# Patient Record
Sex: Male | Born: 1975 | Hispanic: No | Marital: Married | Smoking: Current every day smoker
Health system: Southern US, Community
[De-identification: ages and names within clinical notes are randomized; demographics above are authoritative.]

---

## 2018-12-19 ENCOUNTER — Other Ambulatory Visit: Payer: Self-pay

## 2018-12-19 ENCOUNTER — Emergency Department (HOSPITAL_COMMUNITY)
Admission: EM | Admit: 2018-12-19 | Discharge: 2018-12-20 | Disposition: A | Discharge status: Home / Self Care | Payer: BC Managed Care – PPO | Attending: Emergency Medicine | Admitting: Emergency Medicine

## 2018-12-19 ENCOUNTER — Emergency Department (HOSPITAL_COMMUNITY): Payer: BC Managed Care – PPO

## 2018-12-19 ENCOUNTER — Encounter (HOSPITAL_COMMUNITY): Payer: Self-pay | Admitting: Emergency Medicine

## 2018-12-19 DIAGNOSIS — W1839XA Other fall on same level, initial encounter: Secondary | ICD-10-CM | POA: Insufficient documentation

## 2018-12-19 DIAGNOSIS — S43014A Anterior dislocation of right humerus, initial encounter: Secondary | ICD-10-CM | POA: Insufficient documentation

## 2018-12-19 DIAGNOSIS — S42291A Other displaced fracture of upper end of right humerus, initial encounter for closed fracture: Secondary | ICD-10-CM | POA: Diagnosis not present

## 2018-12-19 DIAGNOSIS — Y9389 Activity, other specified: Secondary | ICD-10-CM | POA: Insufficient documentation

## 2018-12-19 DIAGNOSIS — F17210 Nicotine dependence, cigarettes, uncomplicated: Secondary | ICD-10-CM | POA: Insufficient documentation

## 2018-12-19 DIAGNOSIS — Y999 Unspecified external cause status: Secondary | ICD-10-CM | POA: Diagnosis not present

## 2018-12-19 DIAGNOSIS — Y92512 Supermarket, store or market as the place of occurrence of the external cause: Secondary | ICD-10-CM | POA: Insufficient documentation

## 2018-12-19 DIAGNOSIS — R569 Unspecified convulsions: Secondary | ICD-10-CM | POA: Diagnosis not present

## 2018-12-19 DIAGNOSIS — S4291XA Fracture of right shoulder girdle, part unspecified, initial encounter for closed fracture: Secondary | ICD-10-CM

## 2018-12-19 DIAGNOSIS — S4991XA Unspecified injury of right shoulder and upper arm, initial encounter: Secondary | ICD-10-CM | POA: Diagnosis present

## 2018-12-19 LAB — CBG MONITORING, ED: Glucose-Capillary: 212 mg/dL — ABNORMAL HIGH (ref 70–99)

## 2018-12-19 MED ORDER — MORPHINE SULFATE (PF) 4 MG/ML IV SOLN
4.0000 mg | Freq: Once | INTRAVENOUS | Status: AC
  Administered 2018-12-20: 4 mg via INTRAVENOUS
  Filled 2018-12-19: qty 1

## 2018-12-19 NOTE — ED Provider Notes (Signed)
[facility] Provider Note   CSN: [id number] Arrival date & time: 12/19/18  2309     History   Chief Complaint Chief Complaint  Patient presents with  . Seizures    HPI Rodney Davenport is a 43 y.o. male.     Patient is a 43 year old male with no significant past medical history.  He presents today for evaluation of possible seizure activity.  Patient states that he went to [facility] this afternoon and had some sort of episode.  According to him, he became unresponsive and fell to the floor.  He was told that he was shaking all over.  The ambulance was called, however the patient was back to normal and declined treatment.  After arriving home, the patient began experiencing worsening right shoulder pain and presents for evaluation of this.  He is also having some discomfort in his back.  He denies any fevers or chills.  He denies any headache or weakness.  Patient denies any illicit drug use, but does state that he is a weekend consumer of alcohol.  The history is provided by the patient.  Seizures Seizure activity on arrival: no   Seizure type:  Grand mal Initial focality:  None Postictal symptoms: no confusion   Return to baseline: yes   Severity:  Moderate Timing:  Once   History reviewed. No pertinent past medical history.  There are no active problems to display for this patient.   History reviewed. No pertinent surgical history.      Home Medications    Prior to Admission medications   Not on File    Family History No family history on file.  Social History Social History   Tobacco Use  . Smoking status: Current Every Day Smoker    Packs/day: 0.50    Types: Cigarettes  . Smokeless tobacco: Never Used  Substance Use Topics  . Alcohol use: Yes    Comment: drinks wine every weekend  . Drug use: Not Currently     Allergies   Patient has no known allergies.   Review of Systems Review of Systems   Neurological: Positive for seizures.  All other systems reviewed and are negative.    Physical Exam Updated Vital Signs BP (!) 155/106 (BP Location: Left Arm)   Pulse (!) 123   Temp 98.1 F (36.7 C) (Oral)   Resp 20   Ht 5\' 11"  (1.803 m)   Wt 75 kg   SpO2 96%   BMI 23.06 kg/m   Physical Exam Vitals signs and nursing note reviewed.  Constitutional:      General: He is not in acute distress.    Appearance: He is well-developed. He is not diaphoretic.  HENT:     Head: Normocephalic and atraumatic.  Eyes:     Extraocular Movements: Extraocular movements intact.     Pupils: Pupils are equal, round, and reactive to light.  Neck:     Musculoskeletal: Normal range of motion and neck supple.  Cardiovascular:     Rate and Rhythm: Normal rate and regular rhythm.     Heart sounds: No murmur. No friction rub.  Pulmonary:     Effort: Pulmonary effort is normal. No respiratory distress.     Breath sounds: Normal breath sounds. No wheezing or rales.  Abdominal:     General: Bowel sounds are normal. There is no distension.     Palpations: Abdomen is soft.     Tenderness: There is no abdominal tenderness.  Musculoskeletal:  Normal range of motion.     Comments: There is tenderness over the lateral aspect of the right shoulder.  There is an apparent glenohumeral dislocation.  Ulnar and radial pulses are easily palpable.  He is able to flex, extend, and oppose all fingers without difficulty.  Skin:    General: Skin is warm and dry.  Neurological:     General: No focal deficit present.     Mental Status: He is alert and oriented to person, place, and time.     Cranial Nerves: No cranial nerve deficit.     Coordination: Coordination normal.      ED Treatments / Results  Labs (all labs ordered are listed, but only abnormal results are displayed) Labs Reviewed  CBG MONITORING, ED - Abnormal; Notable for the following components:      Result Value   Glucose-Capillary 212 (*)    All  other components within normal limits  COMPREHENSIVE METABOLIC PANEL  CBC WITH DIFFERENTIAL/PLATELET  ETHANOL  URINALYSIS, ROUTINE W REFLEX MICROSCOPIC  RAPID URINE DRUG SCREEN, HOSP PERFORMED    EKG EKG Interpretation  Date/Time:  Sunday December 19 2018 23:21:43 EDT Ventricular Rate:  116 PR Interval:    QRS Duration: 89 QT Interval:  323 QTC Calculation: 449 R Axis:   -58 Text Interpretation:  Sinus tachycardia Inferior infarct, old Left axis deviation Confirmed by Veryl Speak [phone]) on 12/19/2018 11:29:25 PM   Radiology No results found.  Procedures Reduction of dislocation  Date/Time: 12/20/2018 3:02 AM Performed by: Veryl Speak, MD Authorized by: Veryl Speak, MD  Consent: Verbal consent obtained. Written consent obtained. Risks and benefits: risks, benefits and alternatives were discussed Consent given by: patient Patient understanding: patient states understanding of the procedure being performed Patient consent: the patient's understanding of the procedure matches consent given Procedure consent: procedure consent matches procedure scheduled Relevant documents: relevant documents present and verified Test results: test results available and properly labeled Imaging studies: imaging studies available Patient identity confirmed: verbally with patient, arm band and hospital-assigned identification number Time out: Immediately prior to procedure a "time out" was called to verify the correct patient, procedure, equipment, support staff and site/side marked as required. Local anesthesia used: no  Anesthesia: Local anesthesia used: no  Sedation: Patient sedated: yes Sedation type: moderate (conscious) sedation Sedatives: propofol  Patient tolerance: patient tolerated the procedure well with no immediate complications  .Sedation  Date/Time: 12/20/2018 3:03 AM Performed by: Veryl Speak, MD Authorized by: Veryl Speak, MD   Consent:    Consent obtained:   Verbal and written   Consent given by:  Patient   Risks discussed:  Allergic reaction, inadequate sedation, prolonged hypoxia resulting in organ damage and respiratory compromise necessitating ventilatory assistance and intubation Universal protocol:    Procedure explained and questions answered to patient or proxy's satisfaction: yes     Relevant documents present and verified: yes     Test results available and properly labeled: yes     Imaging studies available: yes     Immediately prior to procedure a time out was called: yes     Patient identity confirmation method:  Arm band, verbally with patient and hospital-assigned identification number Indications:    Procedure performed:  Dislocation reduction   Procedure necessitating sedation performed by:  Physician performing sedation Pre-sedation assessment:    Time since last food or drink:  4 hours   ASA classification: class 1 - normal, healthy patient     Mallampati score:  I - soft palate,  uvula, fauces, pillars visible   Pre-sedation assessments completed and reviewed: airway patency, cardiovascular function, mental status and pain level   Immediate pre-procedure details:    Reassessment: Patient reassessed immediately prior to procedure     Reviewed: vital signs and relevant labs/tests     Verified: bag valve mask available, intubation equipment available, IV patency confirmed and oxygen available   Procedure details (see MAR for exact dosages):    Preoxygenation:  Nasal cannula   Sedation:  Propofol   Intra-procedure monitoring:  Blood pressure monitoring, cardiac monitor, continuous capnometry, continuous pulse oximetry, frequent LOC assessments and frequent vital sign checks   Intra-procedure events: none     Total Provider sedation time (minutes):  20 Post-procedure details:    Post-sedation assessments completed and reviewed: airway patency, cardiovascular function, mental status and pain level     Patient is stable for  discharge or admission: yes     Patient tolerance:  Tolerated well, no immediate complications   (including critical care time)  Medications Ordered in ED Medications  morphine 4 MG/ML injection 4 mg (has no administration in time range)     Initial Impression / Assessment and Plan / ED Course  I have reviewed the triage vital signs and the nursing notes.  Pertinent labs & imaging results that were available during my care of the patient were reviewed by me and considered in my medical decision making (see chart for details).  Patient presents here with complaints of right shoulder pain.  Patient had what sounds like a seizure while at a local drugstore.  He went home after refusing care there and had increasing back and right shoulder pain.  X-rays today reveal a fracture/dislocation of the humerus.  This finding was discussed with Dr. Jena GaussHaddix from orthopedics who recommended reduction in the ED.  This dislocation was easily reduced using conscious sedation with propofol.  X-rays also show what appear to be compression fractures of the thoracic spine and possibly a transverse process fracture in the lumbar spine.  He is neurologically intact and I do not feel these injuries require further imaging.  As far as the episode that occurred in the drugstore, I highly suspect this was a seizure.  Patient lost consciousness and witnesses state that he was shaking for approximately 5 minutes.  Patient has bite marks to the left side of his tongue.  His seizure work-up today is essentially unremarkable.  His glucose is mildly elevated at 205 and patient has no prior history of diabetes.  He also has mildly elevated transaminases.  Patient does admit to drinking alcohol with regularity.  This episode may have been an alcohol withdrawal seizure.    It was discussed with Dr. Otelia LimesLindzen from neurology who does not feel as though hospitalization or medication is indicated at this time.  Patient advised to limit  his alcohol intake.  He will follow-up with orthopedics regarding his right shoulder.    CRITICAL CARE Performed by: Geoffery Lyonsouglas Soham Hollett Total critical care time: 70 minutes Critical care time was exclusive of separately billable procedures and treating other patients. Critical care was necessary to treat or prevent imminent or life-threatening deterioration. Critical care was time spent personally by me on the following activities: development of treatment plan with patient and/or surrogate as well as nursing, discussions with consultants, evaluation of patient's response to treatment, examination of patient, obtaining history from patient or surrogate, ordering and performing treatments and interventions, ordering and review of laboratory studies, ordering and review of radiographic studies,  pulse oximetry and re-evaluation of patient's condition.   Final Clinical Impressions(s) / ED Diagnoses   Final diagnoses:  None    ED Discharge Orders    None       Geoffery Lyonselo, Asli Tokarski, MD 12/20/18 [phone]

## 2018-12-19 NOTE — ED Triage Notes (Signed)
Roommate called EMS stating pt had some seizure like activity without a history of seizures. Pt fell earlier today at [facility] and c/o back, right arm and shoulder pain from fall.

## 2018-12-19 NOTE — ED Notes (Signed)
To x-ray

## 2018-12-20 ENCOUNTER — Emergency Department (HOSPITAL_COMMUNITY): Payer: BC Managed Care – PPO

## 2018-12-20 LAB — CBC WITH DIFFERENTIAL/PLATELET
Abs Immature Granulocytes: 0.08 10*3/uL — ABNORMAL HIGH (ref 0.00–0.07)
Basophils Absolute: 0 10*3/uL (ref 0.0–0.1)
Basophils Relative: 0 %
Eosinophils Absolute: 0 10*3/uL (ref 0.0–0.5)
Eosinophils Relative: 0 %
HCT: 41.3 % (ref 39.0–52.0)
Hemoglobin: 13.9 g/dL (ref 13.0–17.0)
Immature Granulocytes: 1 %
Lymphocytes Relative: 8 %
Lymphs Abs: 0.9 10*3/uL (ref 0.7–4.0)
MCH: 31.9 pg (ref 26.0–34.0)
MCHC: 33.7 g/dL (ref 30.0–36.0)
MCV: 94.7 fL (ref 80.0–100.0)
Monocytes Absolute: 0.4 10*3/uL (ref 0.1–1.0)
Monocytes Relative: 4 %
Neutro Abs: 9.8 10*3/uL — ABNORMAL HIGH (ref 1.7–7.7)
Neutrophils Relative %: 87 %
Platelets: 155 10*3/uL (ref 150–400)
RBC: 4.36 MIL/uL (ref 4.22–5.81)
RDW: 12.7 % (ref 11.5–15.5)
WBC: 11.2 10*3/uL — ABNORMAL HIGH (ref 4.0–10.5)
nRBC: 0 % (ref 0.0–0.2)

## 2018-12-20 LAB — COMPREHENSIVE METABOLIC PANEL
ALT: 74 U/L — ABNORMAL HIGH (ref 0–44)
AST: 148 U/L — ABNORMAL HIGH (ref 15–41)
Albumin: 3.7 g/dL (ref 3.5–5.0)
Alkaline Phosphatase: 96 U/L (ref 38–126)
Anion gap: 14 (ref 5–15)
BUN: 10 mg/dL (ref 6–20)
CO2: 18 mmol/L — ABNORMAL LOW (ref 22–32)
Calcium: 8.7 mg/dL — ABNORMAL LOW (ref 8.9–10.3)
Chloride: 98 mmol/L (ref 98–111)
Creatinine, Ser: 0.98 mg/dL (ref 0.61–1.24)
GFR calc Af Amer: >60 mL/min (ref >60)
GFR calc non Af Amer: >60 mL/min (ref >60)
Glucose, Bld: 205 mg/dL — ABNORMAL HIGH (ref 70–99)
Potassium: 3.4 mmol/L — ABNORMAL LOW (ref 3.5–5.1)
Sodium: 130 mmol/L — ABNORMAL LOW (ref 135–145)
Total Bilirubin: 2.2 mg/dL — ABNORMAL HIGH (ref 0.3–1.2)
Total Protein: 6 g/dL — ABNORMAL LOW (ref 6.5–8.1)

## 2018-12-20 LAB — ETHANOL: Alcohol, Ethyl (B): <10 mg/dL (ref <10)

## 2018-12-20 MED ORDER — HYDROCODONE-ACETAMINOPHEN 5-325 MG PO TABS: 1.0000 | ORAL_TABLET | Freq: Four times a day (QID) | ORAL | 0 refills | Status: AC | PRN

## 2018-12-20 MED ORDER — PROPOFOL 10 MG/ML IV BOLUS
0.5000 mg/kg | Freq: Once | INTRAVENOUS | Status: AC
  Administered 2018-12-20: 37.5 mg via INTRAVENOUS
  Filled 2018-12-20: qty 20

## 2018-12-20 MED ORDER — PROPOFOL 10 MG/ML IV BOLUS
INTRAVENOUS | Status: AC | PRN
  Administered 2018-12-20 (×2): 40 mg via INTRAVENOUS
  Administered 2018-12-20: 37.5 mg via INTRAVENOUS

## 2018-12-20 NOTE — Sedation Documentation (Signed)
Paper op permit signed and placed in medical records

## 2018-12-20 NOTE — Sedation Documentation (Signed)
Pt alert talking  Still having pain in his rt shoulder  Ortho tech has placed a sling to the rt shoulder

## 2018-12-20 NOTE — Discharge Instructions (Signed)
Wear arm sling as applied until followed up by orthopedics.  You should see the orthopedic surgeon in the next few days.  The contact information for Dr. Doreatha Martin has been provided in this discharge summary for you to call and make these arrangements.  Follow-up with neurology in the next week.  The contact information for [facility] neurology has been provided in this discharge summary as well for you to call and make these arrangements.  Take hydrocodone as prescribed as needed for pain.  Ice for 20 minutes every 2 hours while awake for the next 2 days.  No driving until followed up by neurology.

## 2018-12-20 NOTE — Sedation Documentation (Signed)
The pt received 120 mg of propofol iv in 40mg  increments for procedure.

## 2018-12-23 ENCOUNTER — Other Ambulatory Visit: Payer: Self-pay | Admitting: Orthopaedic Surgery

## 2018-12-23 DIAGNOSIS — G8929 Other chronic pain: Secondary | ICD-10-CM

## 2018-12-24 ENCOUNTER — Ambulatory Visit
Admission: RE | Admit: 2018-12-24 | Discharge: 2018-12-24 | Disposition: A | Discharge status: Home / Self Care | Payer: BC Managed Care – PPO | Source: Ambulatory Visit | Attending: Orthopaedic Surgery | Admitting: Orthopaedic Surgery

## 2018-12-24 ENCOUNTER — Other Ambulatory Visit: Payer: Self-pay

## 2018-12-24 DIAGNOSIS — G8929 Other chronic pain: Secondary | ICD-10-CM

## 2019-01-17 ENCOUNTER — Other Ambulatory Visit: Payer: BC Managed Care – PPO

## 2020-07-08 IMAGING — CT CT HEAD WITHOUT CONTRAST
4 series · 15 of 47 positions shown, 17 images · non-contrast
Comparison: None.

CLINICAL DATA: 42 y/o M; fall earlier today. Seizure-like activity
without history of seizures.

EXAM:
CT HEAD WITHOUT CONTRAST
TECHNIQUE: Contiguous axial images were obtained from the base of the skull
through the vertex without intravenous contrast.

[Series 4: head without · axial · non-contrast · 0.44mm/px · z∈[-80,+40]mm · 7 of 32 slices shown, 9 images]
[im 4/32  brain]
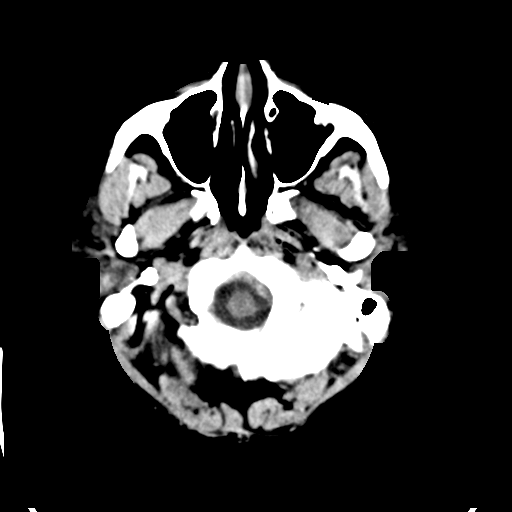
[im 4/32  bone]
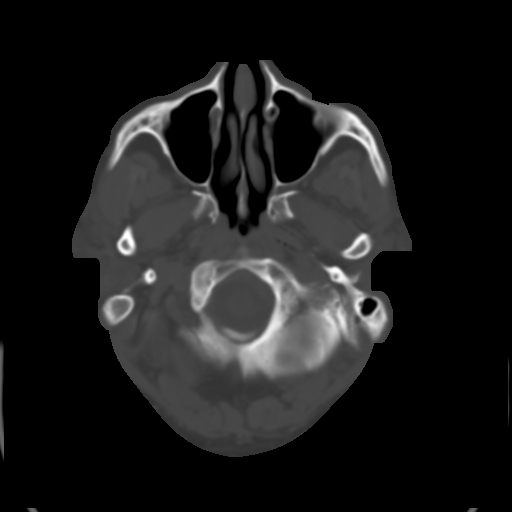
[im 8/32  brain]
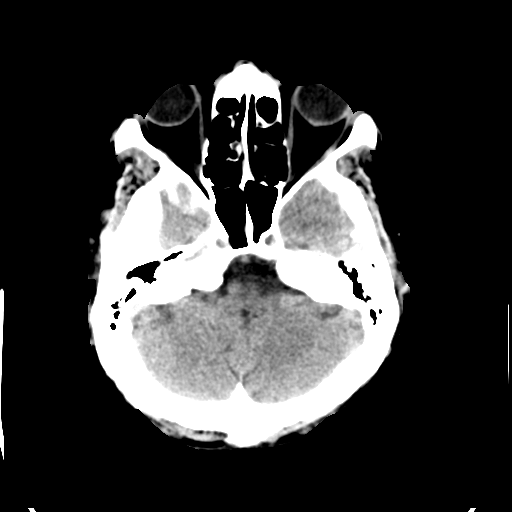
[im 12/32  brain]
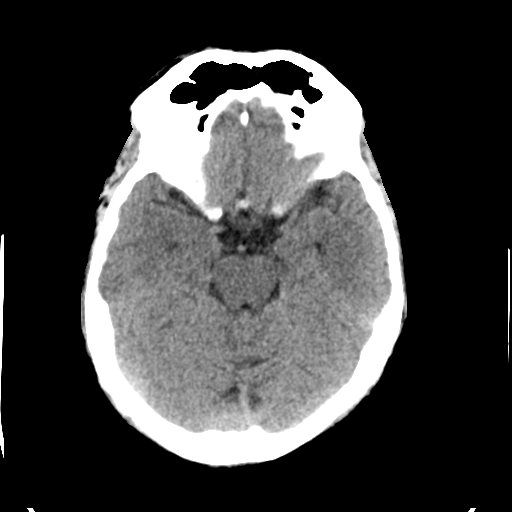
[im 16/32  brain]
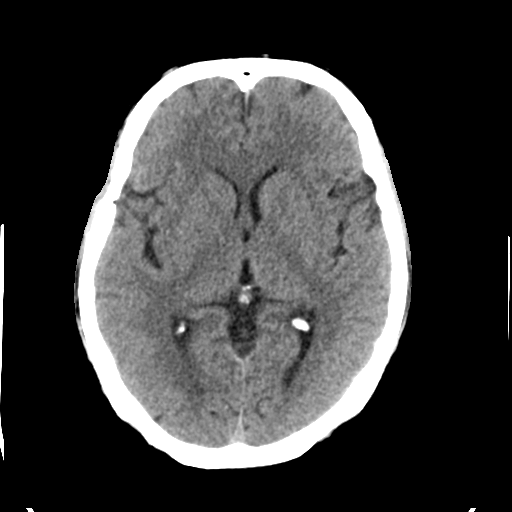
[im 20/32  brain]
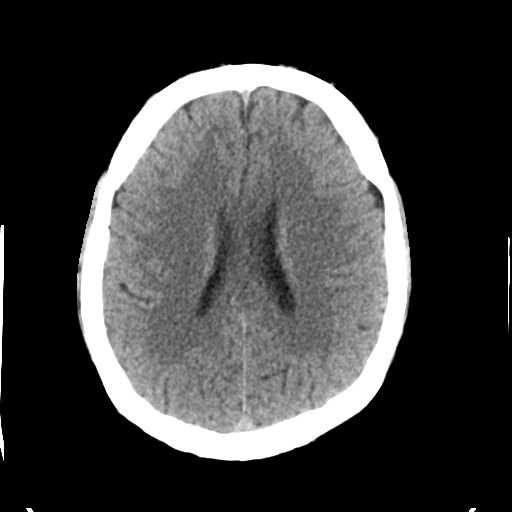
[im 20/32  bone]
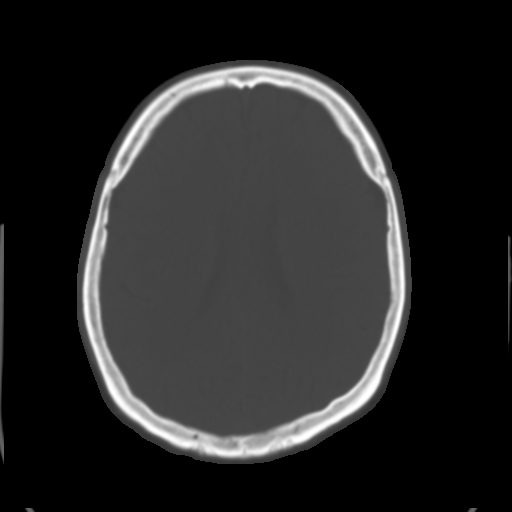
[im 24/32  brain]
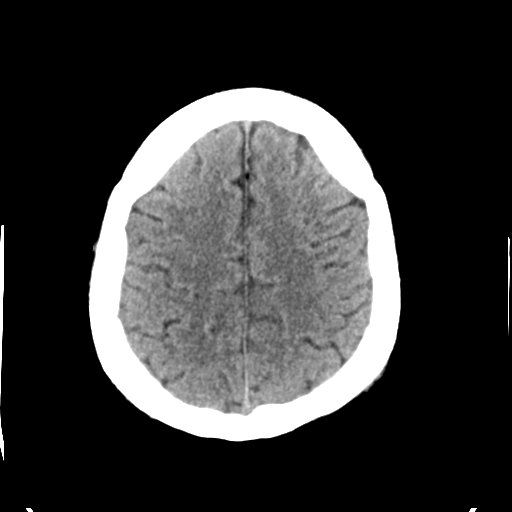
[im 28/32  brain]
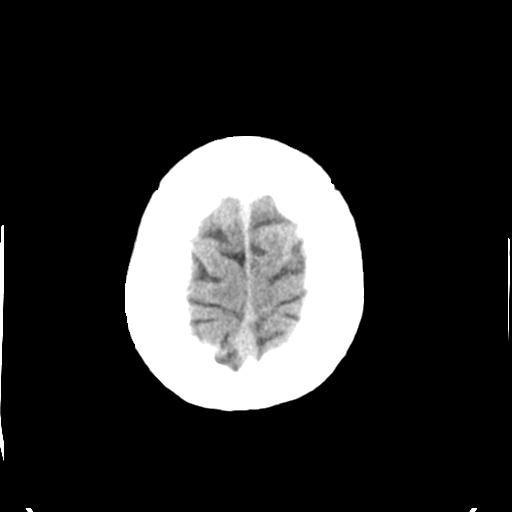

[Series 5: head bone · axial · 0.44mm/px · z∈[-81,-65]mm · 2 of 80 slices shown]
[im 8/80  bone]
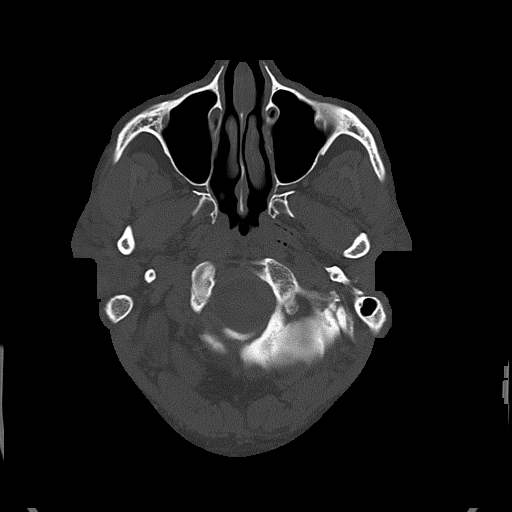
[im 16/80  bone]
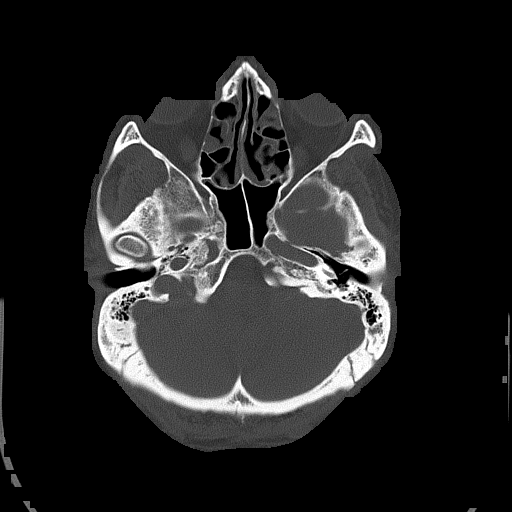

[Series 6: head without cor · coronal · non-contrast · 0.31mm/px · 3 of 67 slices shown]
[im 23/67  brain]
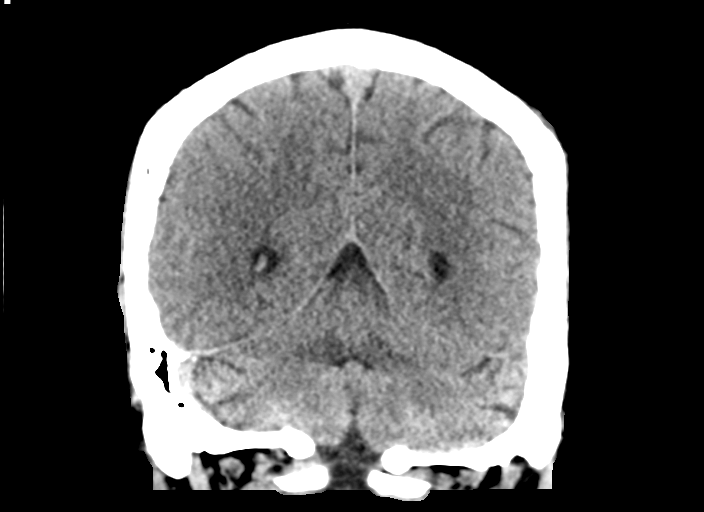
[im 30/67  brain]
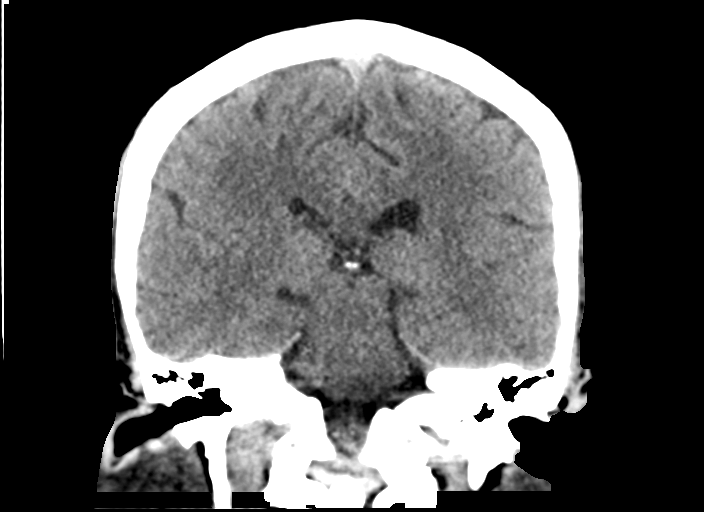
[im 37/67  brain]
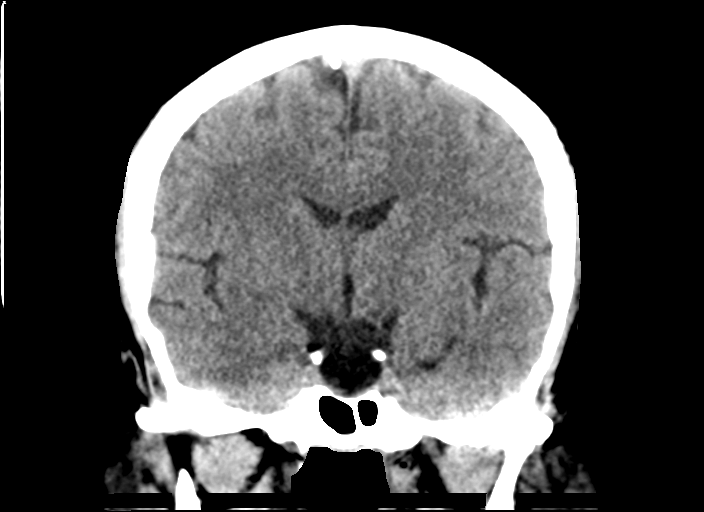

[Series 7: head without sag · sagittal · non-contrast · 0.31mm/px · 3 of 63 slices shown]
[im 21/63  brain]
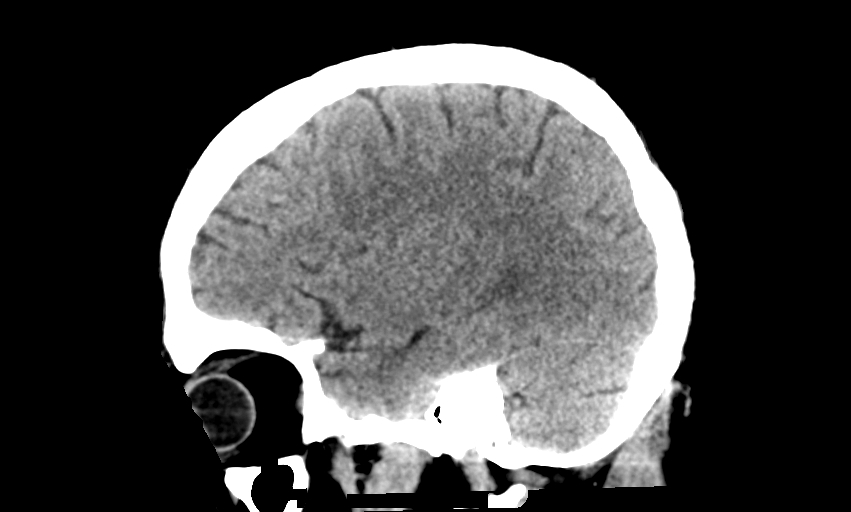
[im 32/63  brain]
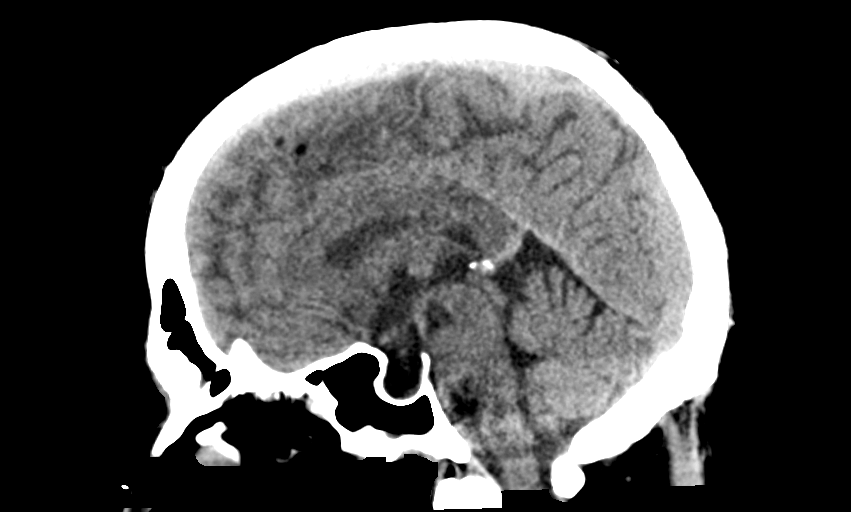
[im 42/63  brain]
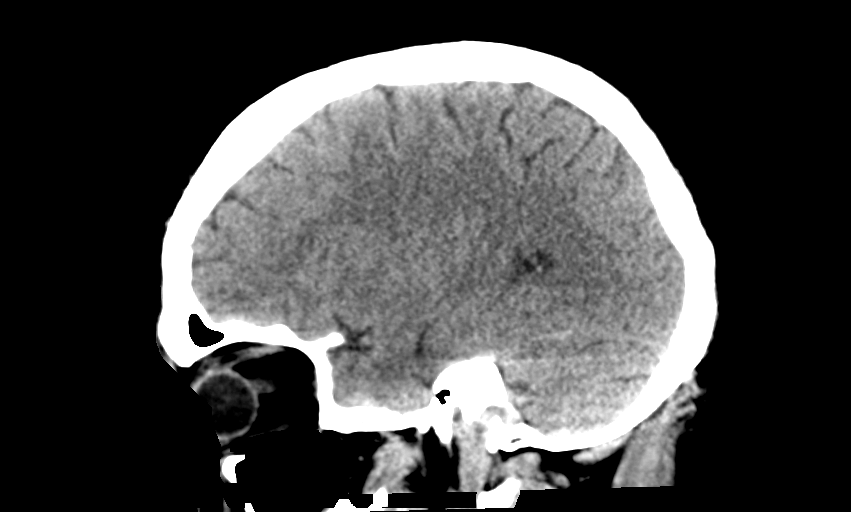

[15 of 47 positions shown; findings below may reference images not displayed]

FINDINGS: Brain: No evidence of acute infarction, hemorrhage, hydrocephalus,
extra-axial collection or mass lesion/mass effect. Enlarged empty
sella turcica.

Vascular: No hyperdense vessel or unexpected calcification.

Skull: Normal. Negative for fracture or focal lesion.

Sinuses/Orbits: Mild diffuse paranasal sinus mucosal thickening.
Normal aeration of mastoid air cells.

Other: None.
IMPRESSION: 1. No acute intracranial abnormality identified.
2. Enlarged empty sella turcica.
3. Mild diffuse paranasal sinus disease.
